# Patient Record
Sex: Male | Born: 1985 | Race: White | Hispanic: No | Marital: Single | Smoking: Current every day smoker
Health system: Southern US, Community
[De-identification: ages and names within clinical notes are randomized; demographics above are authoritative.]

## PROBLEM LIST (undated history)

## (undated) DIAGNOSIS — I10 Essential (primary) hypertension: Secondary | ICD-10-CM

## (undated) DIAGNOSIS — F419 Anxiety disorder, unspecified: Secondary | ICD-10-CM

## (undated) DIAGNOSIS — B1920 Unspecified viral hepatitis C without hepatic coma: Secondary | ICD-10-CM

---
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
## 2007-01-12 ENCOUNTER — Inpatient Hospital Stay (HOSPITAL_COMMUNITY): Admission: EM | Admit: 2007-01-12 | Discharge: 2007-01-15 | Payer: Self-pay | Admitting: Emergency Medicine

## 2008-06-07 IMAGING — CR DG CHEST 2V
2 series · 2 of 2 positions shown · non-contrast
Comparison: 12/03/06.

CLINICAL DATA: Follow-up pneumonia.  
 CHEST - 2 VIEW:

[view not recorded (1 of 2)]
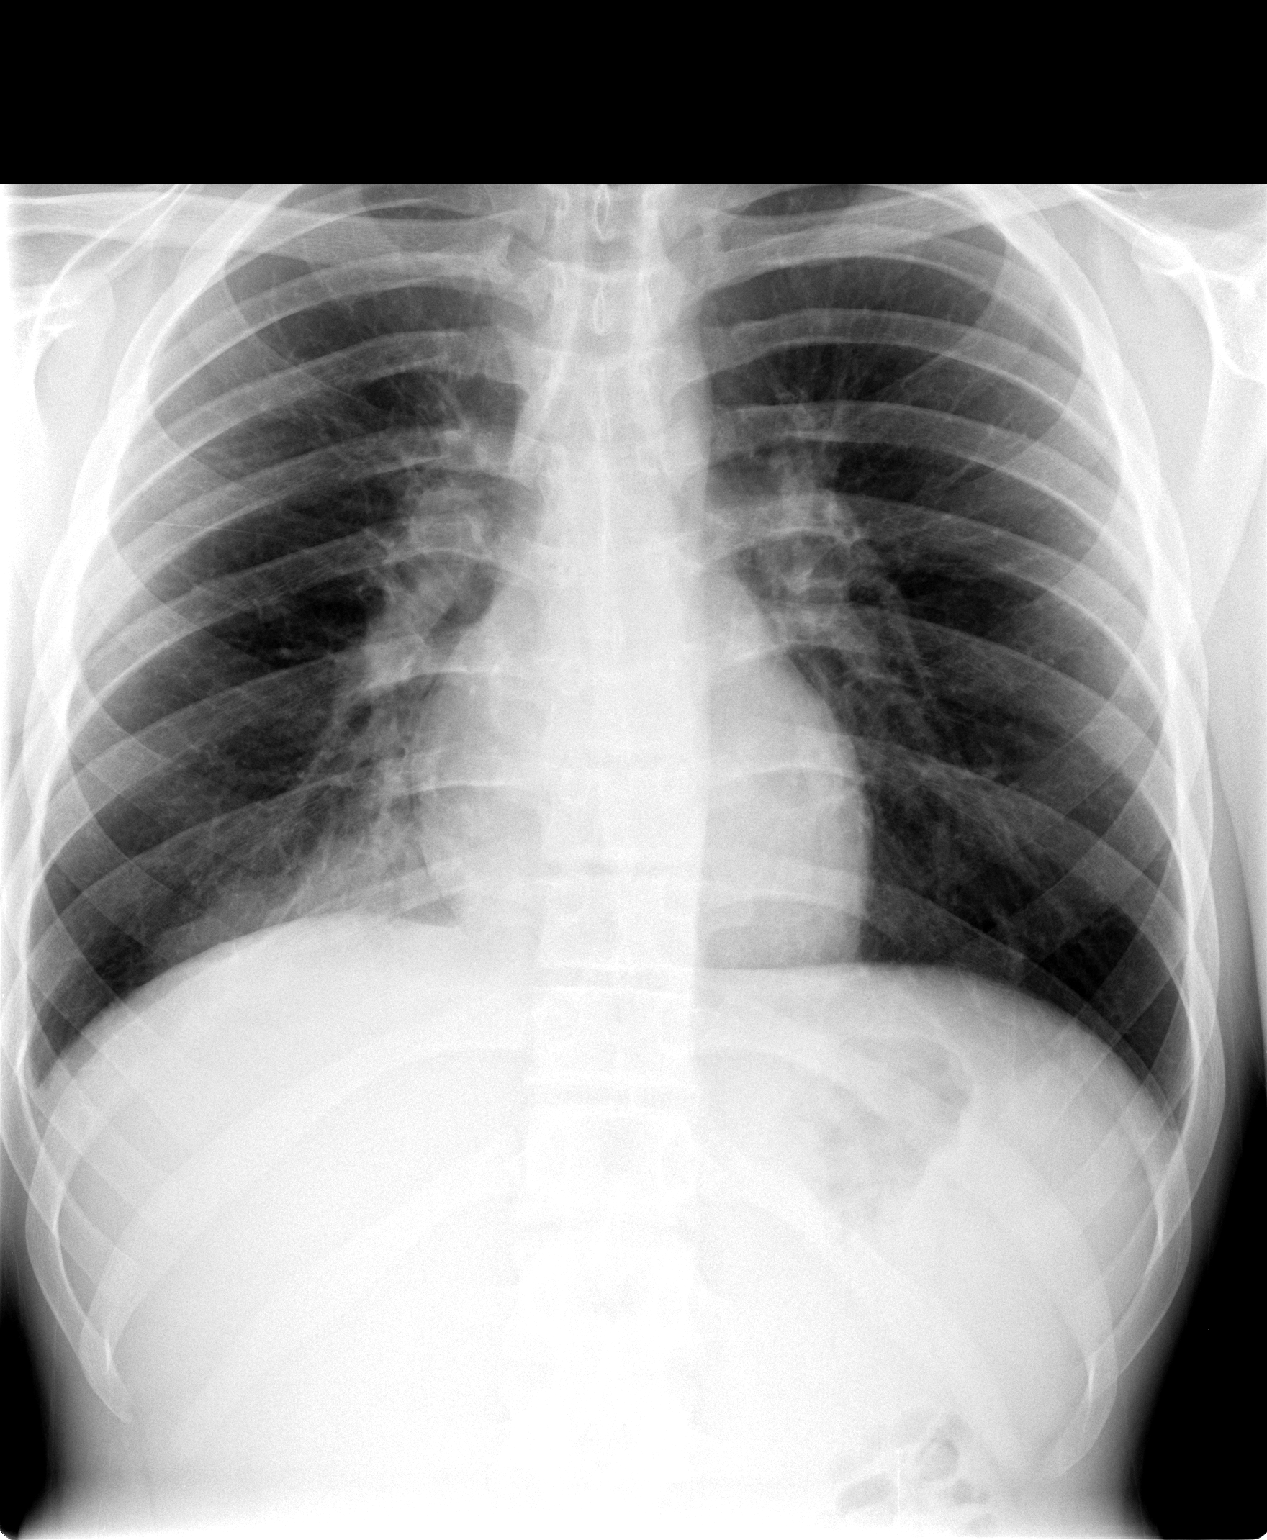

[view not recorded (2 of 2)]
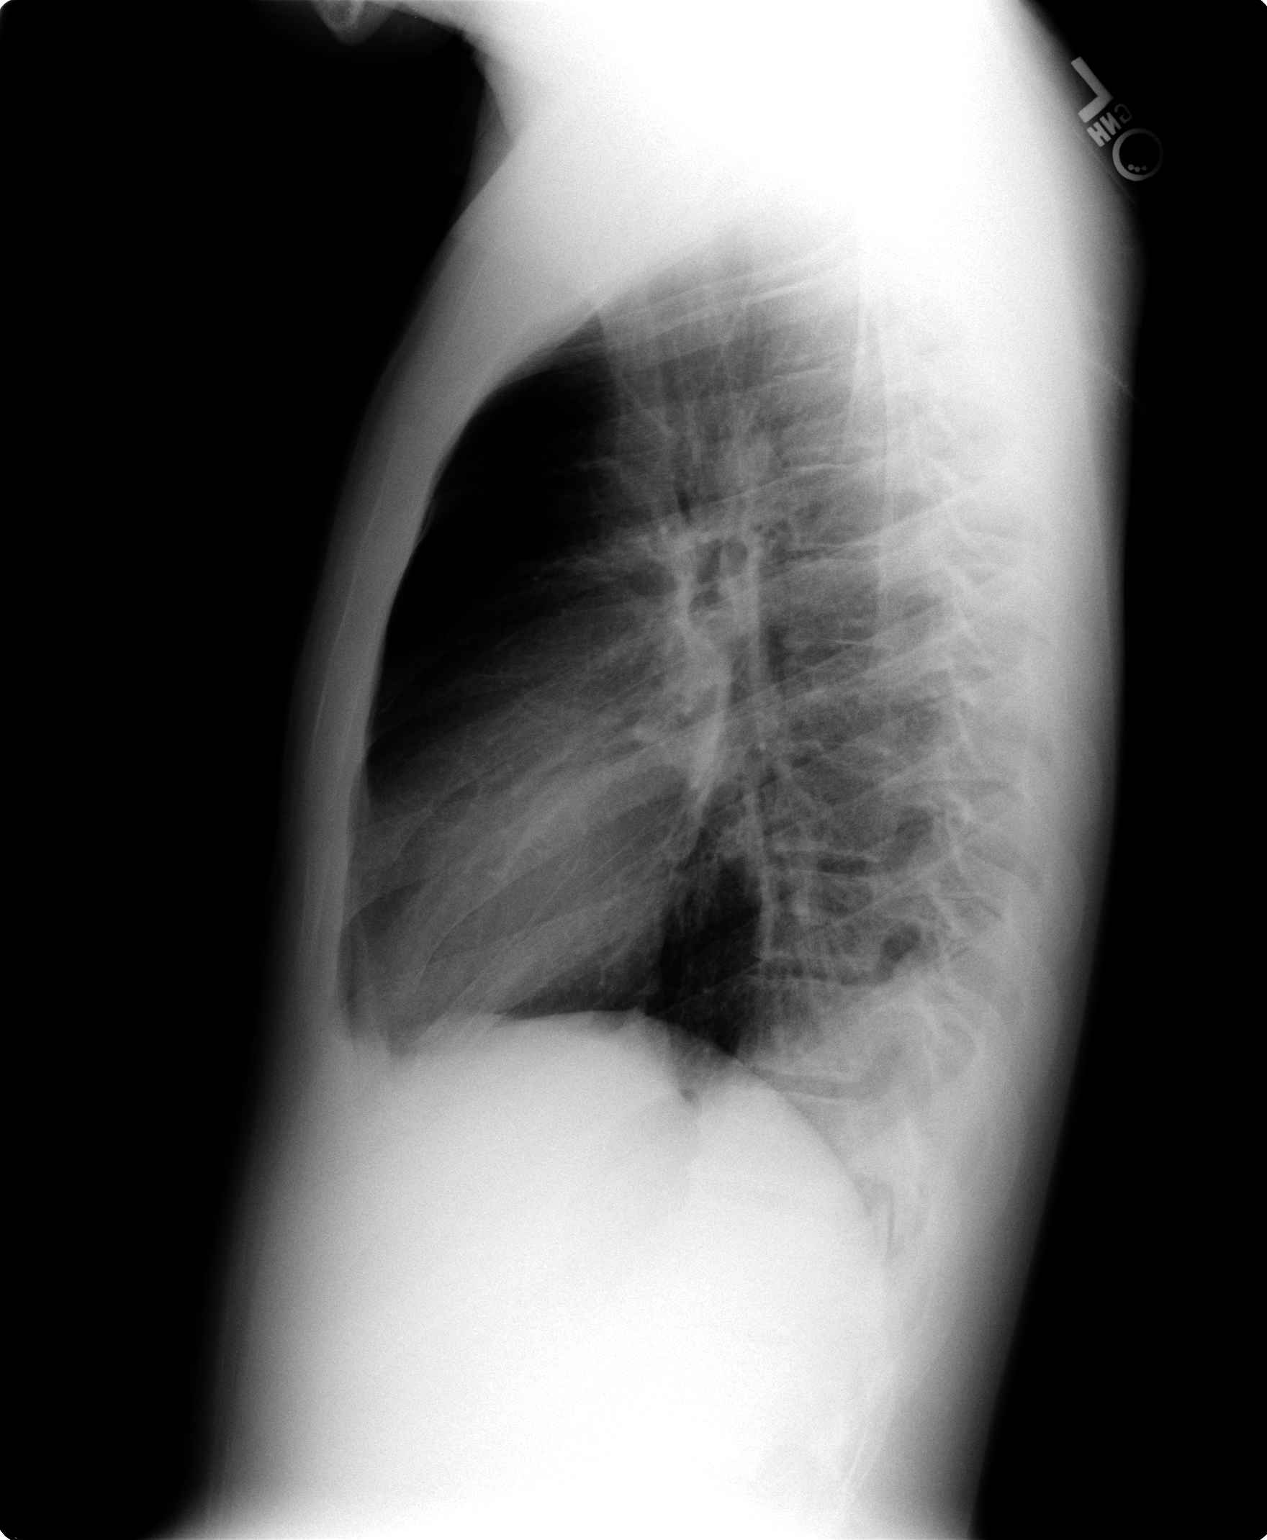

[2 of 2 positions shown; findings below may reference images not displayed]

FINDINGS: Heart size and mediastinum are normal.  The airway is midline.  There is air space disease in the right lower lobe consistent with pneumonia.
IMPRESSION: Right lower lobe pneumonia.

## 2015-09-23 ENCOUNTER — Emergency Department (HOSPITAL_BASED_OUTPATIENT_CLINIC_OR_DEPARTMENT_OTHER)
Admission: EM | Admit: 2015-09-23 | Discharge: 2015-09-24 | Disposition: A | Discharge status: Home / Self Care | Payer: Self-pay | Attending: Physician Assistant | Admitting: Physician Assistant

## 2015-09-23 ENCOUNTER — Encounter (HOSPITAL_BASED_OUTPATIENT_CLINIC_OR_DEPARTMENT_OTHER): Payer: Self-pay | Admitting: *Deleted

## 2015-09-23 DIAGNOSIS — Y9289 Other specified places as the place of occurrence of the external cause: Secondary | ICD-10-CM | POA: Insufficient documentation

## 2015-09-23 DIAGNOSIS — I10 Essential (primary) hypertension: Secondary | ICD-10-CM | POA: Insufficient documentation

## 2015-09-23 DIAGNOSIS — Y9389 Activity, other specified: Secondary | ICD-10-CM | POA: Insufficient documentation

## 2015-09-23 DIAGNOSIS — Z8619 Personal history of other infectious and parasitic diseases: Secondary | ICD-10-CM | POA: Insufficient documentation

## 2015-09-23 DIAGNOSIS — Z720 Tobacco use: Secondary | ICD-10-CM | POA: Insufficient documentation

## 2015-09-23 DIAGNOSIS — Y998 Other external cause status: Secondary | ICD-10-CM | POA: Insufficient documentation

## 2015-09-23 DIAGNOSIS — F419 Anxiety disorder, unspecified: Secondary | ICD-10-CM | POA: Insufficient documentation

## 2015-09-23 DIAGNOSIS — T401X1A Poisoning by heroin, accidental (unintentional), initial encounter: Secondary | ICD-10-CM | POA: Insufficient documentation

## 2015-09-23 HISTORY — DX: Unspecified viral hepatitis C without hepatic coma: B19.20

## 2015-09-23 HISTORY — DX: Anxiety disorder, unspecified: F41.9

## 2015-09-23 HISTORY — DX: Essential (primary) hypertension: I10

## 2015-09-23 MED ORDER — NALOXONE HCL 0.4 MG/ML IJ SOLN
0.4000 mg | Freq: Once | INTRAMUSCULAR | Status: DC
  Filled 2015-09-23: qty 1

## 2015-09-23 NOTE — ED Notes (Signed)
Pt arrived via [facility]. Report states that patient was found by the Fire dept in a bathtub full of cold water with agonal respirations of 4 per minute and pinpoint pupils. Was given  of Narcan nasal per report and an additional 0.5mg  IV was administered by EMS. States patient was alert after Narcan. Blood sugar was 107 and b/p 162/98. Pt denies any recent heroin use. Pt does have bruising noted to left arm. States he took several Xanax but could not give an exact amount. Pt arrives alert and oriented times 3. Denies any pain. VSS. resp even and unlabored.

## 2015-09-23 NOTE — ED Notes (Signed)
I helped patient to stand and give urine sample, transferred urine to bottle, placed aqt bedside if needed.

## 2015-09-23 NOTE — Discharge Instructions (Signed)
Accidental Overdose °A drug overdose occurs when a chemical substance (drug or medication) is used in amounts large enough to overcome a person. This may result in severe illness or death. This is a type of poisoning. Accidental overdoses of medications or other substances come from a variety of reasons. When this happens accidentally, it is often because the person taking the substance does not know enough about what they have taken. Drugs which commonly cause overdose deaths are alcohol, psychotropic medications (medications which affect the mind), pain medications, illegal drugs (street drugs) such as cocaine and heroin, and multiple drugs taken at the same time. It may result from careless behavior (such as over-indulging at a party). Other causes of overdose may include multiple drug use, a lapse in memory, or drug use after a period of no drug use.  °Sometimes overdosing occurs because a person cannot remember if they have taken their medication.  °A common unintentional overdose in young children involves multi-vitamins containing iron. Iron is a part of the hemoglobin molecule in blood. It is used to transport oxygen to living cells. When taken in small amounts, iron allows the body to restock hemoglobin. In large amounts, it causes problems in the body. If this overdose is not treated, it can lead to death. °Never take medicines that show signs of tampering or do not seem quite right. Never take medicines in the dark or in poor lighting. Read the label and check each dose of medicine before you take it. When adults are poisoned, it happens most often through carelessness or lack of information. Taking medicines in the dark or taking medicine prescribed for someone else to treat the same type of problem is a dangerous practice. °SYMPTOMS  °Symptoms of overdose depend on the medication and amount taken. They can vary from over-activity with stimulant over-dosage, to sleepiness from depressants such as  alcohol, narcotics and tranquilizers. Confusion, dizziness, nausea and vomiting may be present. If problems are severe enough coma and death may result. °DIAGNOSIS  °Diagnosis and management are generally straightforward if the drug is known. Otherwise it is more difficult. At times, certain symptoms and signs exhibited by the patient, or blood tests, can reveal the drug in question.  °TREATMENT  °In an emergency department, most patients can be treated with supportive measures. Antidotes may be available if there has been an overdose of opioids or benzodiazepines. A rapid improvement will often occur if this is the cause of overdose. °At home or away from medical care: °· There may be no immediate problems or warning signs in children. °· Not everything works well in all cases of poisoning. °· Take immediate action. Poisons may act quickly. °· If you think someone has swallowed medicine or a household product, and the person is unconscious, having seizures (convulsions), or is not breathing, immediately call for an ambulance. °IF a person is conscious and appears to be doing OK but has swallowed a poison: °· Do not wait to see what effect the poison will have. Immediately call a poison control center (listed in the white pages of your telephone book under "Poison Control" or inside the front cover with other emergency numbers). Some poison control centers have TTY capability for the deaf. Check with your local center if you or someone in your family requires this service. °· Keep the container so you can read the label on the product for ingredients. °· Describe what, when, and how much was taken and the age and condition of the person poisoned.   Inform them if the person is vomiting, choking, drowsy, shows a change in color or temperature of skin, is conscious or unconscious, or is convulsing. °· Do not cause vomiting unless instructed by medical personnel. Do not induce vomiting or force liquids into a person who  is convulsing, unconscious, or very drowsy. °Stay calm and in control.  °· Activated charcoal also is sometimes used in certain types of poisoning and you may wish to add a supply to your emergency medicines. It is available without a prescription. Call a poison control center before using this medication. °PREVENTION  °Thousands of children die every year from unintentional poisoning. This may be from household chemicals, poisoning from carbon monoxide in a car, taking their parent's medications, or simply taking a few iron pills or vitamins with iron. Poisoning comes from unexpected sources. °· Store medicines out of the sight and reach of children, preferably in a locked cabinet. Do not keep medications in a food cabinet. Always store your medicines in a secure place. Get rid of expired medications. °· If you have children living with you or have them as occasional guests, you should have child-resistant caps on your medicine containers. Keep everything out of reach. Child proof your home. °· If you are called to the telephone or to answer the door while you are taking a medicine, take the container with you or put the medicine out of the reach of small children. °· Do not take your medication in front of children. Do not tell your child how good a medication is and how good it is for them. They may get the idea it is more of a treat. °· If you are an adult and have accidentally taken an overdose, you need to consider how this happened and what can be done to prevent it from happening again. If this was from a street drug or alcohol, determine if there is a problem that needs addressing. If you are not sure a problems exists, it is easy to talk to a professional and ask them if they think you have a problem. It is better to handle this problem in this way before it happens again and has a much worse consequence. °Document Released: 03/01/2005 Document Revised: 03/09/2012 Document Reviewed: 08/07/2009 °ExitCare®  Patient Information ©2015 [employer]. This information is not intended to replace advice given to you by your health care provider. Make sure you discuss any questions you have with your health care provider. ° °Emergency Department Resource Guide °1) Find a Doctor and Pay Out of Pocket °Although you won't have to find out who is covered by your insurance plan, it is a good idea to ask around and get recommendations. You will then need to call the office and see if the doctor you have chosen will accept you as a new patient and what types of options they offer for patients who are self-pay. Some doctors offer discounts or will set up payment plans for their patients who do not have insurance, but you will need to ask so you aren't surprised when you get to your appointment. ° °2) Contact Your Local Health Department °Not all health departments have doctors that can see patients for sick visits, but many do, so it is worth a call to see if yours does. If you don't know where your local health department is, you can check in your phone book. The CDC also has a tool to help you locate your state's health department, and many state websites   also have listings of all of their local health departments. ° °3) Find a Walk-in Clinic °If your illness is not likely to be very severe or complicated, you may want to try a walk in clinic. These are popping up all over the country in pharmacies, drugstores, and shopping centers. They're usually staffed by nurse practitioners or physician assistants that have been trained to treat common illnesses and complaints. They're usually fairly quick and inexpensive. However, if you have serious medical issues or chronic medical problems, these are probably not your best option. ° °No Primary Care Doctor: °- Call [facility] at  832-8000 - they can help you locate a primary care doctor that  accepts your insurance, provides certain services, etc. °- Physician Referral Service-  1-800-533-3463 ° °Chronic Pain Problems: °Organization         Address  Phone   Notes  °[facility]  (336) 297-2271 Patients need to be referred by their primary care doctor.  ° °Medication Assistance: °Organization         Address  Phone   Notes  °[place] [facility] [address] °[place], [place] [address] (336) 641-8030 --Must be a resident of [place] °-- Must have NO insurance coverage whatsoever (no Medicaid/ Medicare, etc.) °-- The pt. MUST have a primary care doctor that directs their care regularly and follows them in the community °  °[facility]  (866) 331-1348   °[employer]  (888) 892-1162   ° °Agencies that provide inexpensive medical care: °Organization         Address  Phone   Notes  °[facility]  (336) 832-8035   °[facility]    (336) 832-7272   °[facility] [address] °[place], [place] [address] (336) 832-4777   °[facility] [address], °[place] (336) 271-4999   °[facility]    (336) 373-0678   °[facility]    (336) 272-1050   °[facility] ° [address], [place] Phone:  (336) 832-4444, Fax:  (336) 832-4440 Hours of Operation:  9 am - 6 pm, M-F.  Also accepts Medicaid/Medicare and self-pay.  °[facility] ° [address], [place] Phone: (336) 832-3150, Fax: (336) 832-3151. Hours of Operation:  8:30 am - 5:30 pm, M-F.  Also accepts Medicaid and self-pay.  °[facility] [place] [address], [place] Phone: (336) 878-6027   °[facility] [address], [place], [place] (336)723-1848, Ext. 123 Mondays & Thursdays: 7-9 AM.  First 15 patients are seen on a first come, first serve basis. °  ° °Medicaid-accepting [place] Providers: ° °Organization         Address  Phone   Notes  °[facility] [address],  [place] (336) 641-2100 Also accepts self-pay patients.  °[facility] [address], [place] ° (336) 856-9996   °[facility] [address], [place] (336) 288-8857   °[facility] [address] [place] [address], [place] (336) 299-7000   °Veita Bland [address],   ° (336) 373-1557 Only accepts [place] Access Medicaid patients after they have their name applied to their card.  ° °Self-Pay (no insurance) in [place]: ° °Organization         Address  Phone     Notes  °Sickle Cell Patients, [facility] [address], [place] (336) 832-1970   °[facility] [address], [place] (336) 832-4400   °[facility] ° [address], [place] (336) 992-4800   °[facility]/Dr. Osei-Bonsu ° [address] [place] [address], [place] or [address], [place] (336) [phone] Phone number for both [place] and [place] locations is the same.  °[facility] [address], [place] (336) 299-0000   °[facility] [address] [place] [address], [place] or [address] (336) 852-7530 °(336) 878-2260   °[facility] [address], [place] (336) 350-1642, phone; (336) 294-5005, fax Sees patients 1st and 3rd Saturday of every month.  Must not qualify for public or private insurance (i.e. Medicaid, Medicare, [place] Health Choice, Veterans' Benefits) • Household income should be no more than 200% of the poverty level •The clinic cannot treat you if you are pregnant or think you are pregnant • Sexually transmitted diseases are not treated at the clinic.  ° ° °Dental Care: °Organization         Address  Phone  Notes  °[facility] [address], [place] (336) 641-6152 Accepts children up to age 21 who are enrolled in  Medicaid or [place] Health Choice; pregnant women with a Medicaid card; and children who have applied for Medicaid or [place] Health Choice, but were declined, whose parents can pay a reduced fee at time of service.  °[facility] [place]  [address], [place] (336) 641-7733 Accepts children up to age 21 who are enrolled in Medicaid or [place] Health Choice; pregnant women with a Medicaid card; and children who have applied for Medicaid or [place] Health Choice, but were declined, whose parents can pay a reduced fee at time of service.  °[facility] ° [address], [place] (336) 641-4533 Patients are seen by appointment only. Walk-ins are not accepted. [facility] will see patients 18 years of age and older. °Monday - Tuesday (8am-5pm) °Most Wednesdays (8:30-5pm) °$30 per visit, cash only  °[facility] ° [address], [place] (336) 641-4533 Patients are seen by appointment only. Walk-ins are not accepted. [facility] will see patients 18 years of age and older. °One Wednesday Evening (Monthly: Volunteer Based).  $30 per visit, cash only  °[facility]  (919) 537-3737 for adults; Children under age 4, call Graduate Pediatric Dentistry at ([phone]) [phone]. Children aged 4-14, please call (919) [phone] to request a pediatric application. ° Dental services are provided in all areas of dental care including fillings, crowns and bridges, complete and partial dentures, implants, gum treatment, root canals, and extractions. Preventive care is also provided. Treatment is provided to both adults and children. °Patients are selected via a lottery and there is often a waiting list. °  °[facility] [address], ° ° (336) 763-8833 [email or web address] °  °[facility] [address], [place], [place] (336)723-1848, Ext. 123 Second and Fourth Thursday of each month, opens at 6:30  AM; Clinic ends at 9 AM.  Patients are seen on a first-come first-served basis, and a limited number are seen during each clinic.  ° °[facility] ° [address], [place],  (336) 723-7904     Eligibility Requirements °You must have lived in [place], [place], or [place] counties for at least the last three months. °  You cannot be eligible for state or federal sponsored healthcare insurance, including Veterans Administration, Medicaid, or Medicare. °  You generally cannot be eligible for healthcare insurance through your employer.  °  How to apply: °Eligibility screenings are held every Tuesday and Wednesday afternoon from 1:00 pm until 4:00 pm. You do not need an appointment for the interview!  °[facility] [address], [place], [place] 336-631-2330   °[facility]  336-342-8273   °[facility]  336-703-3100   °[facility]  336-570-6415   ° °Behavioral Health Resources in the Community: °Intensive Outpatient Programs °Organization         Address  Phone  Notes  °[facility] [address], [place], [place] 336-878-6098   °[facility] [address], [place], [place] 336-832-9800   °[facility] [address], [place], [place] ° 336-882-2125   °[facility] [address],  °[place], [place] 1-800-853-5163 or 336-641-4981   °Substance Abuse Resources °Organization         Address  Phone  Notes  °[facility]  336-882-2125   °[facility]  336-784-9470   °[facility]  336-285-9073   °[facility]  336-845-3988   °Residential & Outpatient Substance Abuse Program  1-800-659-3381   °Psychological Services °Organization         Address  Phone  Notes  ° Health  336- 832-9600   °[facility]  336- 378-7881   °[facility] [address], [place] 1-800-853-5163 or  336-641-4981   ° °Mobile Crisis Teams °Organization         Address  Phone  Notes  °[facility], Mobile Crisis Care Unit  1-877-626-1772   °Assertive °[facility] ° [address] [place], [place] 336-834-9664   °Sharon DeEsch [address] °[place] 336-554-5454   ° °Self-Help/Support Groups °Organization         Address  Phone             Notes  °[facility] - variety of support groups  336- 373-1402 Call for more information  °Narcotics Anonymous (NA), [facility] [address], °[place]  2 meetings at this location  ° °Residential Treatment Programs °Organization         Address  Phone  Notes  °[facility] [address],    °[place]  1-866-801-8205   °[facility] ° [address], [place], [place] 704-293-8524   °[facility] [address], [place] 336-845-3988 Admissions: 8am-3pm M-F  °[facility] [address]    °[place], [place] 336-841-1104   °[facility] [address], [place], [place] 336-379-7146   °[facility] [address]  °[place], [place] 336-285-9073   °[facility] - Intensive Outpatient [address], [place], [place] 336-852-3033   °[facility] [address]  °[place], [place] 1-877-615-2722 or 336-784-9470   °[facility] ([facility] [address] [place],  336-227-7417 Accepts Medicaid  °[facility] [address]  °[place]  1-800-659-3381 Substance Abuse/Addiction Treatment  ° °[place] Behavioral Health Resources °Organization           Address  Phone  Notes  °[facility]  (888) 581-9988   °Julie Brannon, PhD [address] [place], [place]   (336) 349-5553 or (336) 951-0000   °[facility]   [address] °[place], [place] (336) 349-4454   °[facility] [address],  [place], [place] (336) 342-8316 Insurance/Medicaid/sponsorship through [facility]  °[facility] [address] [place]                                    [place], [place] (336) 342-8316 Therapy/tele-psych/case  °[facility] [address]  ° [place], [place] (336) 349-2233    °Dr. Arfeen  (336) 349-4544   °[facility] [place]  [employer] [place] [facility] 1) [address],  °2) [address], [place] °3)  [address], [place] (336) 349-3220 °(336) 342-7768 ° °(336) 342-8140   °[place] [facility] (336) 342-1394 or (336) 342-3537 (After Hours)    ° ° °

## 2015-09-23 NOTE — ED Notes (Signed)
MD at bedside. 

## 2015-09-23 NOTE — ED Provider Notes (Signed)
CSN: [id number]     Arrival date & time 09/23/15  2209 History   First MD Initiated Contact with Patient 09/23/15 2221     Chief Complaint  Patient presents with  . Drug Overdose     (Consider location/radiation/quality/duration/timing/severity/associated sxs/prior Treatment) HPI   Patient is a 29 year old male presenting with drug overdose. Patient was found by EMS to be agonal breathing. Two Narcan was given to patient and he woke up. 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  Past Medical History  Diagnosis Date  . Hepatitis C   . Hypertension   . Anxiety    History reviewed. No pertinent past surgical history. No family history on file. Social History  Substance Use Topics  . Smoking status: Current Every Day Smoker  . Smokeless tobacco: None  . Alcohol Use: Yes     Comment: occasional     Review of Systems  Constitutional: Negative for fever and activity change.  HENT: Negative for drooling and hearing loss.   Eyes: Negative for discharge and redness.  Respiratory: Negative for cough and shortness of breath.   Cardiovascular: Negative for chest pain.  Gastrointestinal: Negative for abdominal pain.  Genitourinary: Negative for dysuria and urgency.  Musculoskeletal: Negative for arthralgias.  Allergic/Immunologic: Negative for immunocompromised state.  Neurological: Negative for seizures and speech difficulty.  Psychiatric/Behavioral: Negative for behavioral problems and agitation.  All other systems reviewed and are negative.     Allergies  Review of patient's allergies indicates no known allergies.  Home Medications   Prior to Admission medications   Medication Sig Start Date End Date Taking? Authorizing Provider  alprazolam Prudy Feeler) 2 MG tablet Take 2 mg by mouth 3 (three) times daily as needed for sleep.   Yes Historical Provider, MD   BP 144/105 mmHg  Pulse 86  Temp(Src) 97.5 F (36.4 C)  (Oral)  Resp 18  Ht  (1.854 m)  Wt 165 lb (74.844 kg)  BMI 21.77 kg/m2  SpO2 100% Physical Exam  Constitutional: He is oriented to person, place, and time. He appears well-nourished.  HENT:  Head: Normocephalic.  Mouth/Throat: Oropharynx is clear and moist.  Pinpoint pupils  Eyes: Conjunctivae are normal.  Neck: No tracheal deviation present.  Cardiovascular: Normal rate.   Pulmonary/Chest: Effort normal. No stridor. No respiratory distress.  Abdominal: Soft. There is no tenderness. There is no guarding.  Musculoskeletal: Normal range of motion. He exhibits no edema.  Neurological: He is oriented to person, place, and time. No cranial nerve deficit.  Skin: Skin is warm and dry. No rash noted. He is not diaphoretic.  Psychiatric: He has a normal mood and affect. His behavior is normal.  Nursing note and vitals reviewed.   ED Course  Procedures (including critical care time) Labs Review Labs Reviewed - No data to display  Imaging Review No results found. I have personally reviewed and evaluated these images and lab results as part of my medical decision-making.   EKG Interpretation None      MDM   Final diagnoses:  None   patient is a 23 her old male presenting with heroin overdose. Patient had Narcan revealed with good effect. Patient is awake alert and oriented 3. We explained that he needs to be watched for 4 hours given the extent of respiratory depression that the patient exhibited prior to arrival.    Abelino Derrick, MD 09/23/15 2241

## 2015-09-24 LAB — CBG MONITORING, ED: Glucose-Capillary: 93 mg/dL (ref 65–99)
